# Patient Record
Sex: Male | Born: 2000 | Race: White | Hispanic: No | Marital: Single | Smoking: Never smoker
Health system: Southern US, Community
[De-identification: ages and names within clinical notes are randomized; demographics above are authoritative.]

## PROBLEM LIST (undated history)

## (undated) DIAGNOSIS — R000 Tachycardia, unspecified: Secondary | ICD-10-CM

## (undated) HISTORY — PX: MOLE REMOVAL: SHX2046

---
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
## 2018-02-28 DIAGNOSIS — R350 Frequency of micturition: Secondary | ICD-10-CM | POA: Diagnosis not present

## 2018-02-28 DIAGNOSIS — N419 Inflammatory disease of prostate, unspecified: Secondary | ICD-10-CM | POA: Diagnosis not present

## 2018-03-17 DIAGNOSIS — R350 Frequency of micturition: Secondary | ICD-10-CM | POA: Diagnosis not present

## 2018-03-17 DIAGNOSIS — R109 Unspecified abdominal pain: Secondary | ICD-10-CM | POA: Diagnosis not present

## 2018-03-17 DIAGNOSIS — R195 Other fecal abnormalities: Secondary | ICD-10-CM | POA: Diagnosis not present

## 2018-04-02 IMAGING — DX DG FOOT COMPLETE 3+V*L*
3 series · 3 of 3 positions shown · non-contrast
Comparison: None.

CLINICAL DATA: Bilateral foot pain at the arches for 1 year. The
patient is a runner. No known injury.

EXAM:
LEFT FOOT - COMPLETE 3+ VIEW

[foot ap]
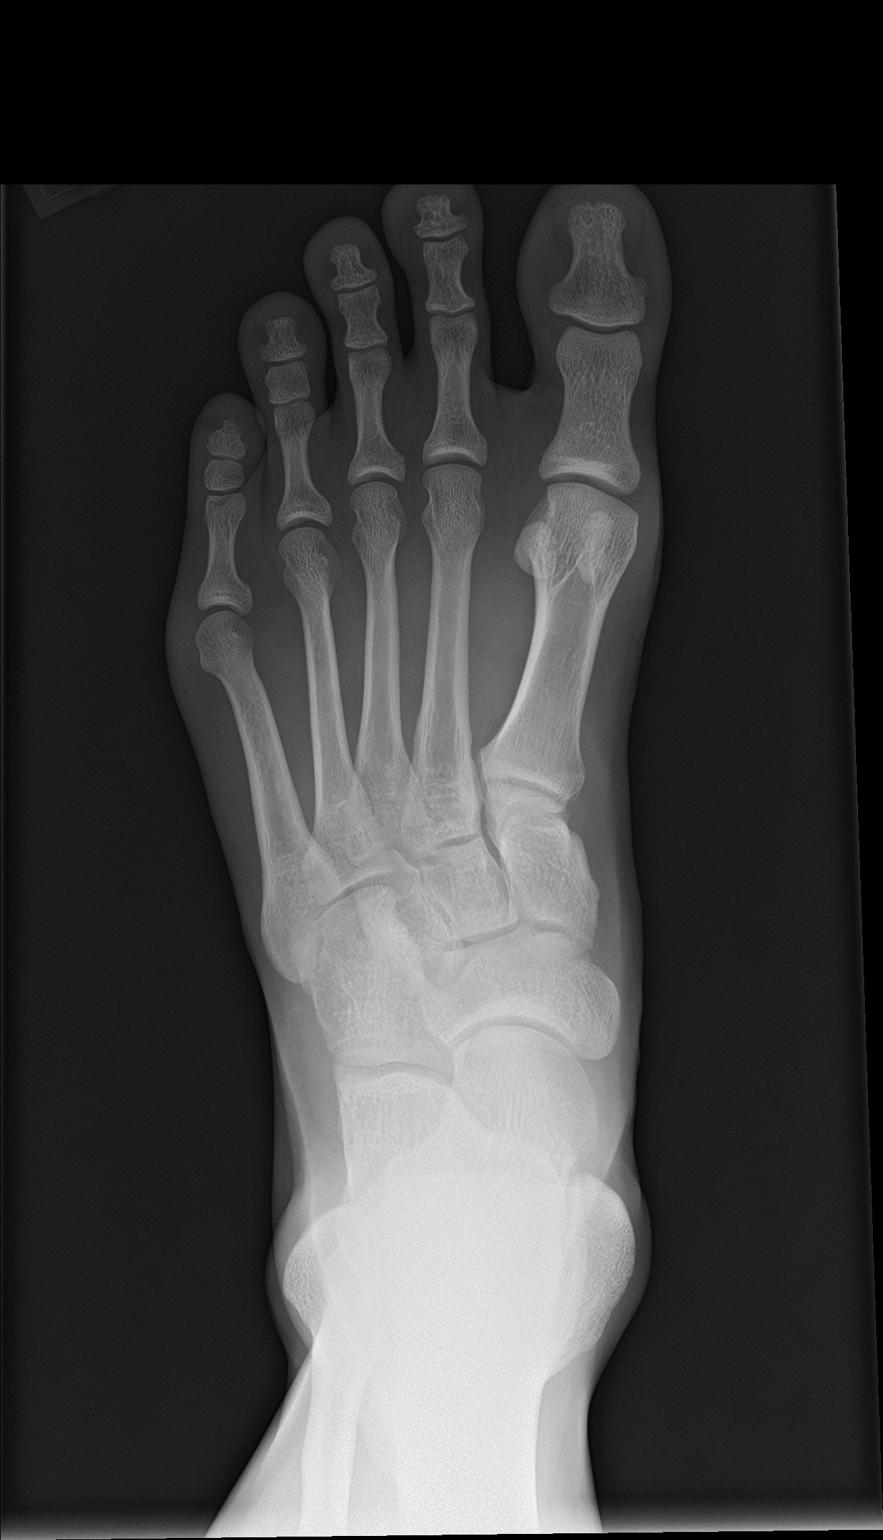

[foot obl]
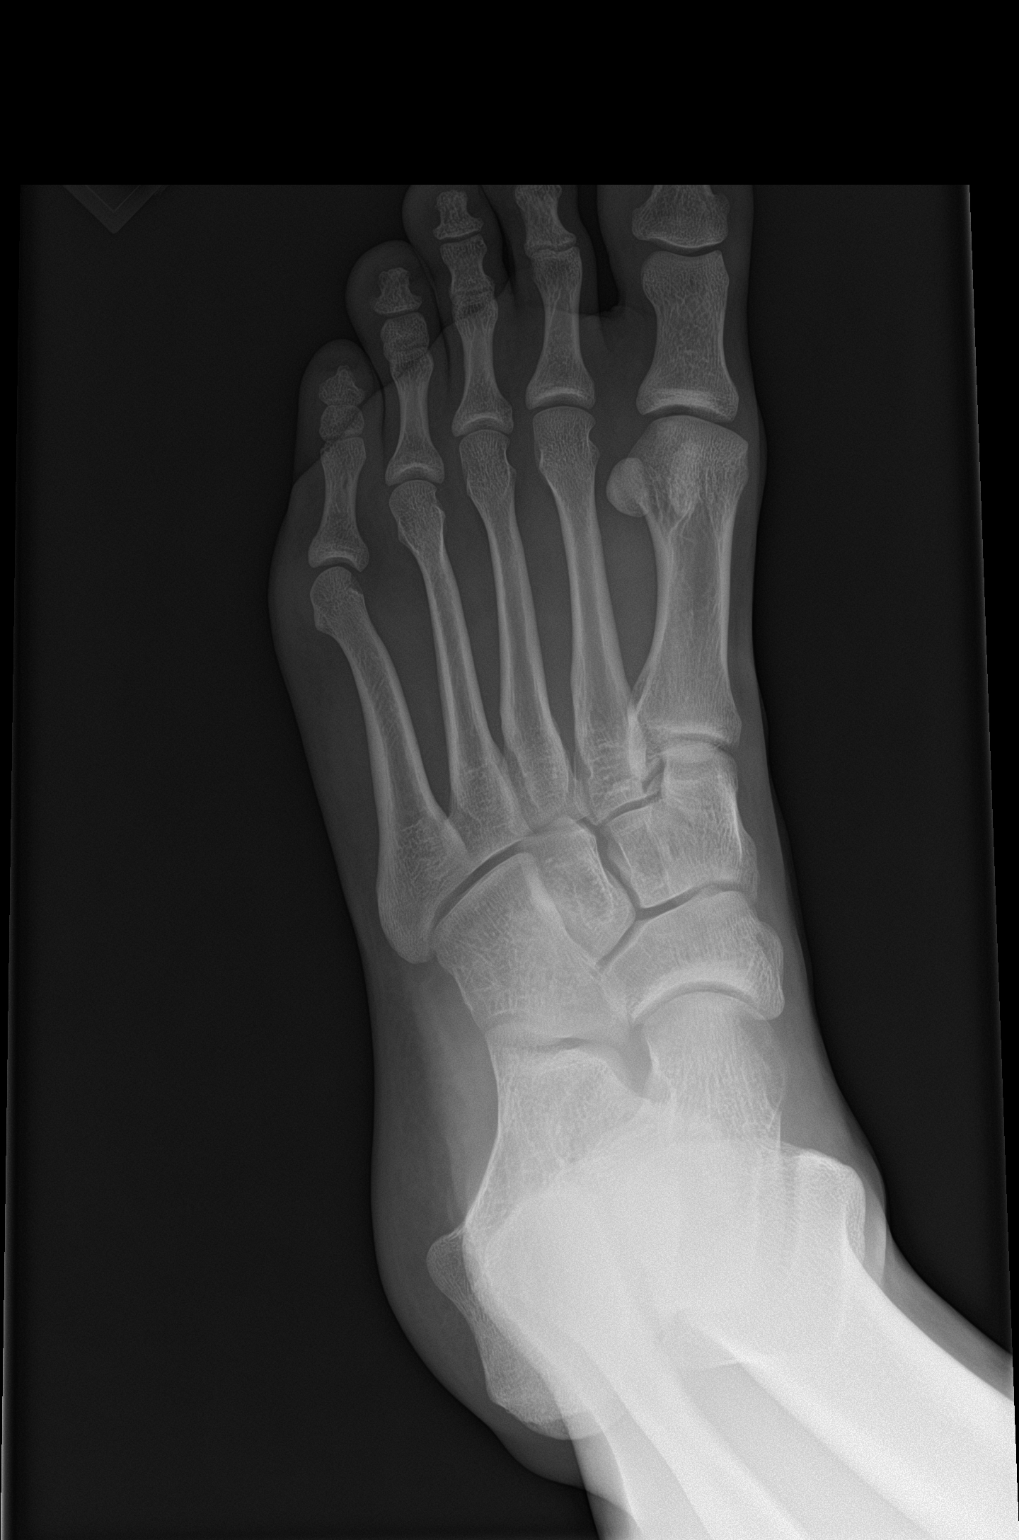

[foot lat]
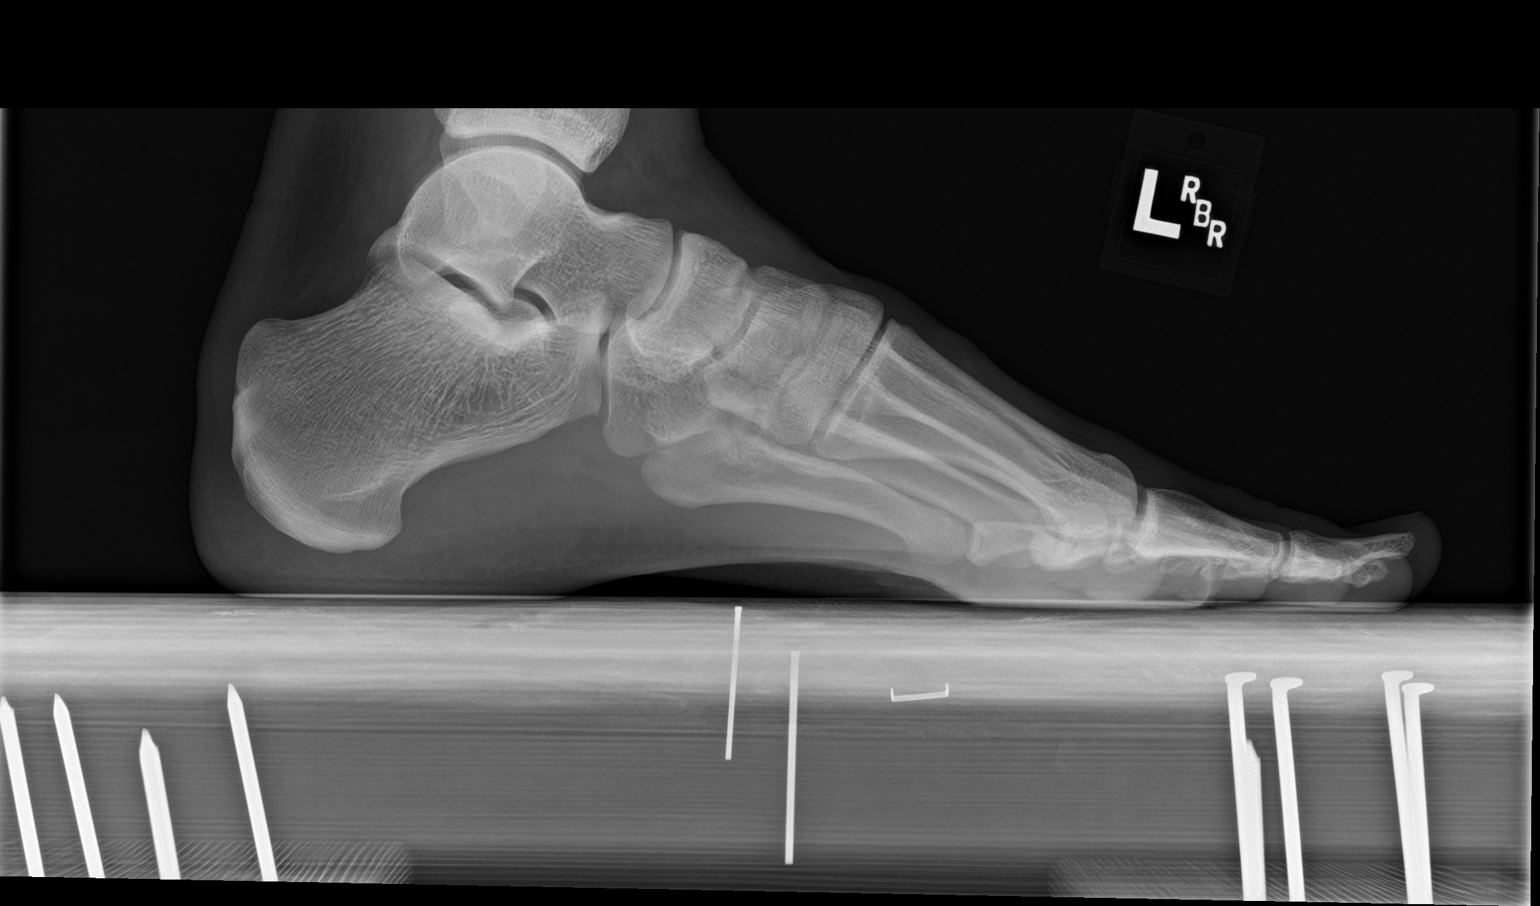

[3 of 3 positions shown; findings below may reference images not displayed]

FINDINGS: There is no evidence of fracture or dislocation. There is no
evidence of arthropathy or other focal bone abnormality. Soft
tissues are unremarkable.
IMPRESSION: Normal exam.

## 2018-04-07 DIAGNOSIS — R350 Frequency of micturition: Secondary | ICD-10-CM | POA: Diagnosis not present

## 2018-05-25 DIAGNOSIS — R350 Frequency of micturition: Secondary | ICD-10-CM | POA: Diagnosis not present

## 2018-05-25 DIAGNOSIS — R3915 Urgency of urination: Secondary | ICD-10-CM | POA: Diagnosis not present

## 2018-05-25 DIAGNOSIS — N411 Chronic prostatitis: Secondary | ICD-10-CM | POA: Diagnosis not present

## 2018-06-25 DIAGNOSIS — R3915 Urgency of urination: Secondary | ICD-10-CM | POA: Diagnosis not present

## 2018-06-25 DIAGNOSIS — R350 Frequency of micturition: Secondary | ICD-10-CM | POA: Diagnosis not present

## 2018-07-06 DIAGNOSIS — M62838 Other muscle spasm: Secondary | ICD-10-CM | POA: Diagnosis not present

## 2018-07-06 DIAGNOSIS — R350 Frequency of micturition: Secondary | ICD-10-CM | POA: Diagnosis not present

## 2018-07-06 DIAGNOSIS — M6281 Muscle weakness (generalized): Secondary | ICD-10-CM | POA: Diagnosis not present

## 2018-07-06 DIAGNOSIS — M6289 Other specified disorders of muscle: Secondary | ICD-10-CM | POA: Diagnosis not present

## 2018-07-13 DIAGNOSIS — R3912 Poor urinary stream: Secondary | ICD-10-CM | POA: Diagnosis not present

## 2018-07-13 DIAGNOSIS — M62838 Other muscle spasm: Secondary | ICD-10-CM | POA: Diagnosis not present

## 2018-07-13 DIAGNOSIS — M6281 Muscle weakness (generalized): Secondary | ICD-10-CM | POA: Diagnosis not present

## 2018-07-13 DIAGNOSIS — R350 Frequency of micturition: Secondary | ICD-10-CM | POA: Diagnosis not present

## 2018-07-20 DIAGNOSIS — R3915 Urgency of urination: Secondary | ICD-10-CM | POA: Diagnosis not present

## 2018-07-20 DIAGNOSIS — M62838 Other muscle spasm: Secondary | ICD-10-CM | POA: Diagnosis not present

## 2018-07-20 DIAGNOSIS — R350 Frequency of micturition: Secondary | ICD-10-CM | POA: Diagnosis not present

## 2018-07-20 DIAGNOSIS — M6281 Muscle weakness (generalized): Secondary | ICD-10-CM | POA: Diagnosis not present

## 2018-08-11 DIAGNOSIS — R350 Frequency of micturition: Secondary | ICD-10-CM | POA: Diagnosis not present

## 2018-08-11 DIAGNOSIS — M62838 Other muscle spasm: Secondary | ICD-10-CM | POA: Diagnosis not present

## 2018-08-11 DIAGNOSIS — R3915 Urgency of urination: Secondary | ICD-10-CM | POA: Diagnosis not present

## 2018-08-11 DIAGNOSIS — M6281 Muscle weakness (generalized): Secondary | ICD-10-CM | POA: Diagnosis not present

## 2018-08-25 DIAGNOSIS — B070 Plantar wart: Secondary | ICD-10-CM | POA: Diagnosis not present

## 2018-12-23 DIAGNOSIS — Z20828 Contact with and (suspected) exposure to other viral communicable diseases: Secondary | ICD-10-CM | POA: Diagnosis not present

## 2019-06-06 DIAGNOSIS — Z23 Encounter for immunization: Secondary | ICD-10-CM | POA: Diagnosis not present

## 2019-06-27 DIAGNOSIS — Z23 Encounter for immunization: Secondary | ICD-10-CM | POA: Diagnosis not present

## 2019-06-28 DIAGNOSIS — L237 Allergic contact dermatitis due to plants, except food: Secondary | ICD-10-CM | POA: Diagnosis not present

## 2019-09-15 DIAGNOSIS — D485 Neoplasm of uncertain behavior of skin: Secondary | ICD-10-CM | POA: Diagnosis not present

## 2019-09-15 DIAGNOSIS — D224 Melanocytic nevi of scalp and neck: Secondary | ICD-10-CM | POA: Diagnosis not present

## 2020-02-20 DIAGNOSIS — M84361A Stress fracture, right tibia, initial encounter for fracture: Secondary | ICD-10-CM | POA: Diagnosis not present

## 2020-08-09 DIAGNOSIS — B370 Candidal stomatitis: Secondary | ICD-10-CM | POA: Diagnosis not present

## 2020-08-24 DIAGNOSIS — L255 Unspecified contact dermatitis due to plants, except food: Secondary | ICD-10-CM | POA: Diagnosis not present

## 2020-10-15 DIAGNOSIS — Y9302 Activity, running: Secondary | ICD-10-CM | POA: Diagnosis not present

## 2020-10-15 DIAGNOSIS — Z20822 Contact with and (suspected) exposure to covid-19: Secondary | ICD-10-CM | POA: Diagnosis not present

## 2020-10-15 DIAGNOSIS — Z751 Person awaiting admission to adequate facility elsewhere: Secondary | ICD-10-CM | POA: Diagnosis not present

## 2020-10-15 DIAGNOSIS — F4310 Post-traumatic stress disorder, unspecified: Secondary | ICD-10-CM | POA: Diagnosis not present

## 2020-10-15 DIAGNOSIS — W1830XA Fall on same level, unspecified, initial encounter: Secondary | ICD-10-CM | POA: Diagnosis not present

## 2020-10-15 DIAGNOSIS — F10129 Alcohol abuse with intoxication, unspecified: Secondary | ICD-10-CM | POA: Diagnosis not present

## 2020-10-15 DIAGNOSIS — S0081XA Abrasion of other part of head, initial encounter: Secondary | ICD-10-CM | POA: Diagnosis not present

## 2020-10-15 DIAGNOSIS — S8002XA Contusion of left knee, initial encounter: Secondary | ICD-10-CM | POA: Diagnosis not present

## 2020-10-15 DIAGNOSIS — S0083XA Contusion of other part of head, initial encounter: Secondary | ICD-10-CM | POA: Diagnosis not present

## 2020-10-15 DIAGNOSIS — Z91410 Personal history of adult physical and sexual abuse: Secondary | ICD-10-CM | POA: Diagnosis not present

## 2020-10-15 DIAGNOSIS — R269 Unspecified abnormalities of gait and mobility: Secondary | ICD-10-CM | POA: Diagnosis not present

## 2020-10-15 DIAGNOSIS — S80212A Abrasion, left knee, initial encounter: Secondary | ICD-10-CM | POA: Diagnosis not present

## 2020-10-15 DIAGNOSIS — Y908 Blood alcohol level of 240 mg/100 ml or more: Secondary | ICD-10-CM | POA: Diagnosis not present

## 2020-10-15 DIAGNOSIS — Z043 Encounter for examination and observation following other accident: Secondary | ICD-10-CM | POA: Diagnosis not present

## 2020-10-15 DIAGNOSIS — R45851 Suicidal ideations: Secondary | ICD-10-CM | POA: Diagnosis not present

## 2020-10-15 DIAGNOSIS — R4182 Altered mental status, unspecified: Secondary | ICD-10-CM | POA: Diagnosis not present

## 2020-10-15 DIAGNOSIS — Z9151 Personal history of suicidal behavior: Secondary | ICD-10-CM | POA: Diagnosis not present

## 2020-10-16 DIAGNOSIS — F10920 Alcohol use, unspecified with intoxication, uncomplicated: Secondary | ICD-10-CM | POA: Diagnosis not present

## 2020-10-16 DIAGNOSIS — R45851 Suicidal ideations: Secondary | ICD-10-CM | POA: Diagnosis not present

## 2020-10-16 DIAGNOSIS — F1020 Alcohol dependence, uncomplicated: Secondary | ICD-10-CM | POA: Diagnosis not present

## 2020-10-16 DIAGNOSIS — F439 Reaction to severe stress, unspecified: Secondary | ICD-10-CM | POA: Diagnosis not present

## 2020-10-17 DIAGNOSIS — S8002XA Contusion of left knee, initial encounter: Secondary | ICD-10-CM | POA: Diagnosis not present

## 2020-10-17 DIAGNOSIS — F10129 Alcohol abuse with intoxication, unspecified: Secondary | ICD-10-CM | POA: Diagnosis not present

## 2020-10-17 DIAGNOSIS — R45851 Suicidal ideations: Secondary | ICD-10-CM | POA: Diagnosis not present

## 2020-10-17 DIAGNOSIS — Z20822 Contact with and (suspected) exposure to covid-19: Secondary | ICD-10-CM | POA: Diagnosis not present

## 2020-10-17 DIAGNOSIS — S0081XD Abrasion of other part of head, subsequent encounter: Secondary | ICD-10-CM | POA: Diagnosis not present

## 2020-10-17 DIAGNOSIS — Z751 Person awaiting admission to adequate facility elsewhere: Secondary | ICD-10-CM | POA: Diagnosis not present

## 2020-10-17 DIAGNOSIS — S0081XA Abrasion of other part of head, initial encounter: Secondary | ICD-10-CM | POA: Diagnosis not present

## 2020-10-17 DIAGNOSIS — F4310 Post-traumatic stress disorder, unspecified: Secondary | ICD-10-CM | POA: Diagnosis not present

## 2020-10-17 DIAGNOSIS — Y9302 Activity, running: Secondary | ICD-10-CM | POA: Diagnosis not present

## 2020-10-17 DIAGNOSIS — L700 Acne vulgaris: Secondary | ICD-10-CM | POA: Diagnosis not present

## 2020-10-17 DIAGNOSIS — W1830XD Fall on same level, unspecified, subsequent encounter: Secondary | ICD-10-CM | POA: Diagnosis not present

## 2020-10-17 DIAGNOSIS — W1830XA Fall on same level, unspecified, initial encounter: Secondary | ICD-10-CM | POA: Diagnosis not present

## 2020-10-17 DIAGNOSIS — L649 Androgenic alopecia, unspecified: Secondary | ICD-10-CM | POA: Diagnosis not present

## 2020-10-17 DIAGNOSIS — Z008 Encounter for other general examination: Secondary | ICD-10-CM | POA: Diagnosis not present

## 2020-10-17 DIAGNOSIS — F4325 Adjustment disorder with mixed disturbance of emotions and conduct: Secondary | ICD-10-CM | POA: Diagnosis not present

## 2020-10-17 DIAGNOSIS — Z9151 Personal history of suicidal behavior: Secondary | ICD-10-CM | POA: Diagnosis not present

## 2020-10-17 DIAGNOSIS — Z79899 Other long term (current) drug therapy: Secondary | ICD-10-CM | POA: Diagnosis not present

## 2020-10-17 DIAGNOSIS — Z91410 Personal history of adult physical and sexual abuse: Secondary | ICD-10-CM | POA: Diagnosis not present

## 2020-10-17 DIAGNOSIS — S80212A Abrasion, left knee, initial encounter: Secondary | ICD-10-CM | POA: Diagnosis not present

## 2020-10-17 DIAGNOSIS — Y908 Blood alcohol level of 240 mg/100 ml or more: Secondary | ICD-10-CM | POA: Diagnosis not present

## 2020-10-17 DIAGNOSIS — S40212D Abrasion of left shoulder, subsequent encounter: Secondary | ICD-10-CM | POA: Diagnosis not present

## 2020-10-17 DIAGNOSIS — F1010 Alcohol abuse, uncomplicated: Secondary | ICD-10-CM | POA: Diagnosis not present

## 2020-10-17 DIAGNOSIS — F4324 Adjustment disorder with disturbance of conduct: Secondary | ICD-10-CM | POA: Diagnosis not present

## 2020-10-17 DIAGNOSIS — S0083XA Contusion of other part of head, initial encounter: Secondary | ICD-10-CM | POA: Diagnosis not present

## 2020-10-24 DIAGNOSIS — F329 Major depressive disorder, single episode, unspecified: Secondary | ICD-10-CM | POA: Diagnosis not present

## 2020-11-02 DIAGNOSIS — F1020 Alcohol dependence, uncomplicated: Secondary | ICD-10-CM | POA: Diagnosis not present

## 2020-11-14 DIAGNOSIS — F1020 Alcohol dependence, uncomplicated: Secondary | ICD-10-CM | POA: Diagnosis not present

## 2020-11-23 DIAGNOSIS — F1020 Alcohol dependence, uncomplicated: Secondary | ICD-10-CM | POA: Diagnosis not present

## 2021-03-06 DIAGNOSIS — D225 Melanocytic nevi of trunk: Secondary | ICD-10-CM | POA: Diagnosis not present

## 2021-03-06 DIAGNOSIS — D235 Other benign neoplasm of skin of trunk: Secondary | ICD-10-CM | POA: Diagnosis not present

## 2021-03-06 DIAGNOSIS — D2272 Melanocytic nevi of left lower limb, including hip: Secondary | ICD-10-CM | POA: Diagnosis not present

## 2021-03-06 DIAGNOSIS — D2371 Other benign neoplasm of skin of right lower limb, including hip: Secondary | ICD-10-CM | POA: Diagnosis not present

## 2021-03-06 DIAGNOSIS — D2271 Melanocytic nevi of right lower limb, including hip: Secondary | ICD-10-CM | POA: Diagnosis not present

## 2021-03-06 DIAGNOSIS — D224 Melanocytic nevi of scalp and neck: Secondary | ICD-10-CM | POA: Diagnosis not present

## 2021-03-06 DIAGNOSIS — D485 Neoplasm of uncertain behavior of skin: Secondary | ICD-10-CM | POA: Diagnosis not present

## 2021-03-06 DIAGNOSIS — D2372 Other benign neoplasm of skin of left lower limb, including hip: Secondary | ICD-10-CM | POA: Diagnosis not present

## 2021-03-14 DIAGNOSIS — Z7251 High risk heterosexual behavior: Secondary | ICD-10-CM | POA: Diagnosis not present

## 2021-03-14 DIAGNOSIS — Z23 Encounter for immunization: Secondary | ICD-10-CM | POA: Diagnosis not present

## 2021-03-18 ENCOUNTER — Emergency Department
Admission: RE | Admit: 2021-03-18 | Discharge: 2021-03-18 | Disposition: A | Discharge status: Home / Self Care | Payer: BC Managed Care – PPO | Source: Ambulatory Visit

## 2021-03-18 ENCOUNTER — Other Ambulatory Visit: Payer: Self-pay

## 2021-03-18 VITALS — BP 138/82 | HR 90 | Temp 97.9°F | Resp 18 | Ht 70.0 in | Wt 145.0 lb

## 2021-03-18 DIAGNOSIS — Z202 Contact with and (suspected) exposure to infections with a predominantly sexual mode of transmission: Secondary | ICD-10-CM

## 2021-03-18 NOTE — ED Triage Notes (Signed)
Pt presents to Urgent Care for STI testing--requesting only a test for herpes d/t likely exposure. No s/s reported.

## 2021-03-18 NOTE — ED Provider Notes (Signed)
[facility]    CSN: [id number] Arrival date & time: 03/18/21  1748      History   Chief Complaint No chief complaint on file.   HPI Cory Esparza is a 21 y.o. male.   HPI 21 year old male presents with request for STD testing specifically is herpes test.  Patient denies any current symptoms.  Patient requests specific test for herpes and has no symptoms.  Advised patient that we could not draw blood test for asymptomatic HSV-1 or HSV-2.  Patient opted to have office charges refunded back to him prior to the start of my examination this evening.  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 Authorizing Provider  finasteride (PROPECIA) 1 MG tablet Take 1 mg by mouth daily.   Yes [provider]  EPIDUO 0.1-2.5 % gel  10/31/13   [provider]  naproxen (NAPROSYN) 500 MG tablet Take 1 tablet (500 mg total) by mouth 2 (two) times daily as needed. 04/08/17   Lake, Christoper P, MD  tobramycin (TOBREX) 0.3 % ophthalmic solution Place 1 drop into the left eye every 4 (four) hours. 04/05/17   Elson Areas, PA-C    Family History Family History  Problem Relation Age of Onset   Healthy Mother    Skin cancer Father     Social History Social History   Tobacco Use   Smoking status: Never   Smokeless tobacco: Never  Vaping Use   Vaping Use: Never used  Substance Use Topics   Alcohol use: Yes    Alcohol/week: 3.0 standard drinks    Types: 3 Standard drinks or equivalent per week    Comment: weekly   Drug  use: Not Currently     Allergies   Patient has no known allergies.   Review of Systems Review of Systems  All other systems reviewed and are negative.   Physical Exam Triage Vital Signs ED Triage Vitals  Enc Vitals Group     BP 03/18/21 1823 138/82     Pulse Rate 03/18/21 1823 90     Resp 03/18/21 1823 18     Temp 03/18/21 1823 97.9 F (36.6 C)     Temp Source 03/18/21 1823 Oral     SpO2 03/18/21 1823 97 %     Weight 03/18/21 1818 145 lb (65.8 kg)     Height 03/18/21 1818 5\' 10"  (1.778 m)     Head Circumference --      Peak Flow --      Pain Score 03/18/21 1817 0     Pain Loc --      Pain Edu? --      Excl. in GC? --    No data found.  Updated Vital Signs BP 138/82 (BP Location: Right Arm)    Pulse 90    Temp 97.9 F (36.6 C) (Oral)    Resp 18    Ht 5\' 10"  (1.778 m)    Wt 145 lb (65.8 kg)  SpO2 97%    BMI 20.81 kg/m      Physical Exam No physical exam performed this evening patient requested money to be refunded for office visit today.  UC Treatments / Results  Labs (all labs ordered are listed, but only abnormal results are displayed) Labs Reviewed - No data to display  EKG   Radiology No results found.  Procedures Procedures (including critical care time)  Medications Ordered in UC Medications - No data to display  Initial Impression / Assessment and Plan / UC Course  I have reviewed the triage vital signs and the nursing notes.  Pertinent labs & imaging results that were available during my care of the patient were reviewed by me and considered in my medical decision making (see chart for details).     MDM: 1.  Potential exposure to STD Final Clinical Impressions(s) / UC Diagnoses   Final diagnoses:  None   Discharge Instructions   None    ED Prescriptions   None    PDMP not reviewed this encounter.   Trevor Iha, FNP 03/18/21 1901

## 2021-03-18 NOTE — ED Notes (Signed)
Pt d/c'd approx 25 minutes ago after being told by M. Ragan, FNP that he could not have serological HSV testing done here at [facility].

## 2021-05-17 DIAGNOSIS — H66002 Acute suppurative otitis media without spontaneous rupture of ear drum, left ear: Secondary | ICD-10-CM | POA: Diagnosis not present

## 2022-08-18 DIAGNOSIS — F339 Major depressive disorder, recurrent, unspecified: Secondary | ICD-10-CM | POA: Diagnosis not present

## 2022-08-25 DIAGNOSIS — F339 Major depressive disorder, recurrent, unspecified: Secondary | ICD-10-CM | POA: Diagnosis not present

## 2022-09-01 DIAGNOSIS — F339 Major depressive disorder, recurrent, unspecified: Secondary | ICD-10-CM | POA: Diagnosis not present

## 2022-09-08 DIAGNOSIS — D229 Melanocytic nevi, unspecified: Secondary | ICD-10-CM | POA: Diagnosis not present

## 2022-09-08 DIAGNOSIS — L720 Epidermal cyst: Secondary | ICD-10-CM | POA: Diagnosis not present

## 2022-09-08 DIAGNOSIS — D2371 Other benign neoplasm of skin of right lower limb, including hip: Secondary | ICD-10-CM | POA: Diagnosis not present

## 2022-09-08 DIAGNOSIS — D485 Neoplasm of uncertain behavior of skin: Secondary | ICD-10-CM | POA: Diagnosis not present

## 2022-09-15 DIAGNOSIS — F339 Major depressive disorder, recurrent, unspecified: Secondary | ICD-10-CM | POA: Diagnosis not present

## 2022-09-22 DIAGNOSIS — F339 Major depressive disorder, recurrent, unspecified: Secondary | ICD-10-CM | POA: Diagnosis not present

## 2022-09-29 DIAGNOSIS — F339 Major depressive disorder, recurrent, unspecified: Secondary | ICD-10-CM | POA: Diagnosis not present

## 2022-10-06 DIAGNOSIS — F339 Major depressive disorder, recurrent, unspecified: Secondary | ICD-10-CM | POA: Diagnosis not present

## 2022-10-06 DIAGNOSIS — L720 Epidermal cyst: Secondary | ICD-10-CM | POA: Diagnosis not present

## 2022-10-20 DIAGNOSIS — L720 Epidermal cyst: Secondary | ICD-10-CM | POA: Diagnosis not present

## 2022-10-27 DIAGNOSIS — F339 Major depressive disorder, recurrent, unspecified: Secondary | ICD-10-CM | POA: Diagnosis not present

## 2022-11-03 DIAGNOSIS — F339 Major depressive disorder, recurrent, unspecified: Secondary | ICD-10-CM | POA: Diagnosis not present
